# Patient Record
Sex: Female | Born: 1960 | Race: White | Hispanic: No | Marital: Single | Smoking: Never smoker
Health system: Southern US, Community
[De-identification: ages and names within clinical notes are randomized; demographics above are authoritative.]

## PROBLEM LIST (undated history)

## (undated) DIAGNOSIS — K649 Unspecified hemorrhoids: Secondary | ICD-10-CM

## (undated) DIAGNOSIS — M8580 Other specified disorders of bone density and structure, unspecified site: Secondary | ICD-10-CM

## (undated) DIAGNOSIS — Z789 Other specified health status: Secondary | ICD-10-CM

## (undated) DIAGNOSIS — C801 Malignant (primary) neoplasm, unspecified: Secondary | ICD-10-CM

## (undated) DIAGNOSIS — K8050 Calculus of bile duct without cholangitis or cholecystitis without obstruction: Secondary | ICD-10-CM

## (undated) DIAGNOSIS — C50811 Malignant neoplasm of overlapping sites of right female breast: Secondary | ICD-10-CM

## (undated) HISTORY — DX: Calculus of bile duct without cholangitis or cholecystitis without obstruction: K80.50

## (undated) HISTORY — PX: MASTECTOMY: SHX3

## (undated) HISTORY — DX: Other specified disorders of bone density and structure, unspecified site: M85.80

## (undated) HISTORY — DX: Malignant neoplasm of overlapping sites of right female breast: C50.811

## (undated) HISTORY — DX: Unspecified hemorrhoids: K64.9

---

## 2008-07-22 HISTORY — PX: BREAST LUMPECTOMY: SHX2

## 2009-07-22 DIAGNOSIS — C801 Malignant (primary) neoplasm, unspecified: Secondary | ICD-10-CM

## 2009-07-22 HISTORY — DX: Malignant (primary) neoplasm, unspecified: C80.1

## 2014-12-22 DIAGNOSIS — C50811 Malignant neoplasm of overlapping sites of right female breast: Secondary | ICD-10-CM | POA: Insufficient documentation

## 2014-12-22 DIAGNOSIS — M8580 Other specified disorders of bone density and structure, unspecified site: Secondary | ICD-10-CM | POA: Insufficient documentation

## 2014-12-22 HISTORY — DX: Malignant neoplasm of overlapping sites of right female breast: C50.811

## 2016-03-20 ENCOUNTER — Other Ambulatory Visit: Payer: Self-pay | Admitting: Family Medicine

## 2016-03-20 DIAGNOSIS — R591 Generalized enlarged lymph nodes: Secondary | ICD-10-CM

## 2016-03-26 ENCOUNTER — Ambulatory Visit
Admission: RE | Admit: 2016-03-26 | Discharge: 2016-03-26 | Disposition: A | Discharge status: Home / Self Care | Payer: BLUE CROSS/BLUE SHIELD | Source: Ambulatory Visit | Attending: Family Medicine | Admitting: Family Medicine

## 2016-03-26 DIAGNOSIS — R591 Generalized enlarged lymph nodes: Secondary | ICD-10-CM | POA: Insufficient documentation

## 2016-10-30 ENCOUNTER — Encounter: Payer: Self-pay | Admitting: Emergency Medicine

## 2016-10-30 ENCOUNTER — Emergency Department: Payer: BLUE CROSS/BLUE SHIELD

## 2016-10-30 ENCOUNTER — Emergency Department
Admission: EM | Admit: 2016-10-30 | Discharge: 2016-10-30 | Disposition: A | Discharge status: Home / Self Care | Payer: BLUE CROSS/BLUE SHIELD | Attending: Surgery | Admitting: Surgery

## 2016-10-30 DIAGNOSIS — Z853 Personal history of malignant neoplasm of breast: Secondary | ICD-10-CM | POA: Insufficient documentation

## 2016-10-30 DIAGNOSIS — K8070 Calculus of gallbladder and bile duct without cholecystitis without obstruction: Secondary | ICD-10-CM | POA: Diagnosis not present

## 2016-10-30 DIAGNOSIS — R1011 Right upper quadrant pain: Secondary | ICD-10-CM | POA: Diagnosis present

## 2016-10-30 DIAGNOSIS — K8050 Calculus of bile duct without cholangitis or cholecystitis without obstruction: Secondary | ICD-10-CM | POA: Diagnosis not present

## 2016-10-30 DIAGNOSIS — K8020 Calculus of gallbladder without cholecystitis without obstruction: Secondary | ICD-10-CM

## 2016-10-30 HISTORY — DX: Malignant (primary) neoplasm, unspecified: C80.1

## 2016-10-30 LAB — COMPREHENSIVE METABOLIC PANEL
ALBUMIN: 4.8 g/dL (ref 3.5–5.0)
ALK PHOS: 62 U/L (ref 38–126)
ALT: 20 U/L (ref 14–54)
AST: 31 U/L (ref 15–41)
Anion gap: 11 (ref 5–15)
BUN: 15 mg/dL (ref 6–20)
CALCIUM: 10 mg/dL (ref 8.9–10.3)
CHLORIDE: 101 mmol/L (ref 101–111)
CO2: 26 mmol/L (ref 22–32)
CREATININE: 0.8 mg/dL (ref 0.44–1.00)
GFR calc non Af Amer: >60 mL/min (ref >60)
GLUCOSE: 130 mg/dL — AB (ref 65–99)
Potassium: 4.4 mmol/L (ref 3.5–5.1)
SODIUM: 138 mmol/L (ref 135–145)
Total Bilirubin: 0.8 mg/dL (ref 0.3–1.2)
Total Protein: 8.4 g/dL — ABNORMAL HIGH (ref 6.5–8.1)

## 2016-10-30 LAB — LIPASE, BLOOD: LIPASE: 17 U/L (ref 11–51)

## 2016-10-30 LAB — URINALYSIS, COMPLETE (UACMP) WITH MICROSCOPIC
Bacteria, UA: NONE SEEN
Bilirubin Urine: NEGATIVE
Glucose, UA: NEGATIVE mg/dL
KETONES UR: NEGATIVE mg/dL
Nitrite: NEGATIVE
PH: 6 (ref 5.0–8.0)
PROTEIN: 30 mg/dL — AB
Specific Gravity, Urine: 1.025 (ref 1.005–1.030)

## 2016-10-30 LAB — CBC
HCT: 41.9 % (ref 35.0–47.0)
Hemoglobin: 13.9 g/dL (ref 12.0–16.0)
MCH: 27.4 pg (ref 26.0–34.0)
MCHC: 33.2 g/dL (ref 32.0–36.0)
MCV: 82.6 fL (ref 80.0–100.0)
PLATELETS: 268 10*3/uL (ref 150–440)
RBC: 5.07 MIL/uL (ref 3.80–5.20)
RDW: 13.6 % (ref 11.5–14.5)
WBC: 16.2 10*3/uL — ABNORMAL HIGH (ref 3.6–11.0)

## 2016-10-30 LAB — TROPONIN I: Troponin I: <0.03 ng/mL (ref <0.03)

## 2016-10-30 MED ORDER — OXYCODONE-ACETAMINOPHEN 5-325 MG PO TABS: 1.0000 | ORAL_TABLET | Freq: Four times a day (QID) | ORAL | 0 refills | Status: DC | PRN

## 2016-10-30 MED ORDER — FAMOTIDINE 20 MG PO TABS
40.0000 mg | ORAL_TABLET | Freq: Once | ORAL | Status: AC
  Administered 2016-10-30: 40 mg via ORAL
  Filled 2016-10-30: qty 2

## 2016-10-30 MED ORDER — NAPROXEN 500 MG PO TABS: 500.0000 mg | ORAL_TABLET | Freq: Two times a day (BID) | ORAL | 0 refills | Status: DC

## 2016-10-30 MED ORDER — GI COCKTAIL ~~LOC~~
30.0000 mL | ORAL | Status: AC
  Administered 2016-10-30: 30 mL via ORAL
  Filled 2016-10-30: qty 30

## 2016-10-30 MED ORDER — ONDANSETRON 4 MG PO TBDP: 4.0000 mg | ORAL_TABLET | Freq: Three times a day (TID) | ORAL | 0 refills | Status: DC | PRN

## 2016-10-30 MED ORDER — KETOROLAC TROMETHAMINE 60 MG/2ML IM SOLN
15.0000 mg | Freq: Once | INTRAMUSCULAR | Status: AC
  Administered 2016-10-30: 15 mg via INTRAMUSCULAR
  Filled 2016-10-30: qty 2

## 2016-10-30 MED ORDER — ONDANSETRON 4 MG PO TBDP
8.0000 mg | ORAL_TABLET | Freq: Once | ORAL | Status: AC
  Administered 2016-10-30: 8 mg via ORAL
  Filled 2016-10-30: qty 2

## 2016-10-30 NOTE — Discharge Instructions (Signed)
Take pain medicine as needed for your symptoms. If you develop unremitting vomiting, severe pain, or fever, return to the nearest hospital for evaluation.

## 2016-10-30 NOTE — ED Provider Notes (Signed)
[facility] Emergency Department Provider Note  ____________________________________________  Time seen: Approximately 4:25 PM  I have reviewed the triage vital signs and the nursing notes.   HISTORY  Chief Complaint Abdominal Pain    HPI Julie Logan is a 56 y.o. female who complains of upper abdominal pain since last night. Mostly constant, waxing and waning. Nausea and vomiting. No diarrhea or constipation. No fever or chills. Never had anything like this before. Rates it as severe. Worse with movement and change in position. Not exertional, not pleuritic. It does not seem to be affected by eating, but she has not eaten today.     Past Medical History:  Diagnosis Date  . Cancer [facility])    breast cancer  Internal hemorrhoids   Patient Active Problem List   Diagnosis Date Noted  . Biliary colic      History reviewed. No pertinent surgical history. Mastectomy Sigmoidoscopy 10 years ago  Prior to Admission medications   Medication Sig Start Date End Date Taking? Authorizing Provider  naproxen (NAPROSYN) 500 MG tablet Take 1 tablet (500 mg total) by mouth 2 (two) times daily with a meal. 10/30/16   Carrie Mew, MD  ondansetron (ZOFRAN ODT) 4 MG disintegrating tablet Take 1 tablet (4 mg total) by mouth every 8 (eight) hours as needed for nausea or vomiting. 10/30/16   Carrie Mew, MD  oxyCODONE-acetaminophen (ROXICET) 5-325 MG tablet Take 1 tablet by mouth every 6 (six) hours as needed for severe pain. 10/30/16   Carrie Mew, MD  None   Allergies Patient has no known allergies.   History reviewed. No pertinent family history.  Social History Social History  Substance Use Topics  . Smoking status: Never Smoker  . Smokeless tobacco: Never Used  . Alcohol use No    Review of Systems  Constitutional:   No fever or chills.  ENT:   No sore throat. No rhinorrhea. Cardiovascular:   No chest pain. Respiratory:   No dyspnea or  cough. Gastrointestinal:   Positive as above for abdominal pain with vomiting. No diarrhea..  Genitourinary:   Negative for dysuria or difficulty urinating. Musculoskeletal:   Negative for focal pain or swelling Neurological:   Negative for headaches 10-point ROS otherwise negative.  ____________________________________________   PHYSICAL EXAM:  VITAL SIGNS: ED Triage Vitals  Enc Vitals Group     BP 10/30/16 1409 126/75     Pulse Rate 10/30/16 1409 76     Resp --      Temp 10/30/16 1409 99.6 F (37.6 C)     Temp Source 10/30/16 1409 Oral     SpO2 10/30/16 1409 97 %     Weight 10/30/16 1409 170 lb (77.1 kg)     Height 10/30/16 1409 5\' 3"  (1.6 m)     Head Circumference --      Peak Flow --      Pain Score 10/30/16 1408 7     Pain Loc --      Pain Edu? --      Excl. in [place]? --     Vital signs reviewed, nursing assessments reviewed.   Constitutional:   Alert and oriented. Well appearing and in no distress. Eyes:   No scleral icterus. No conjunctival pallor. PERRL. EOMI.  No nystagmus. ENT   Head:   Normocephalic and atraumatic.   Nose:   No congestion/rhinnorhea. No septal hematoma   Mouth/Throat:   MMM, no pharyngeal erythema. No peritonsillar mass.    Neck:  No stridor. No SubQ emphysema. No meningismus. Hematological/Lymphatic/Immunilogical:   No cervical lymphadenopathy. Cardiovascular:   RRR. Symmetric bilateral radial and DP pulses.  No murmurs.  Respiratory:   Normal respiratory effort without tachypnea nor retractions. Breath sounds are clear and equal bilaterally. No wheezes/rales/rhonchi. Gastrointestinal:   Soft with significant right upper quadrant tenderness and otherwise mild generalized tenderness.. Non distended. There is no CVA tenderness.  No rebound, rigidity, or guarding. Genitourinary:   deferred Musculoskeletal:   Normal range of motion in all extremities. No joint effusions.  No lower extremity tenderness.  No edema. Neurologic:   Normal  speech and language.  CN 2-10 normal. Motor grossly intact. No gross focal neurologic deficits are appreciated.  Skin:    Skin is warm, dry and intact. No rash noted.  No petechiae, purpura, or bullae.  ____________________________________________    LABS (pertinent positives/negatives) (all labs ordered are listed, but only abnormal results are displayed) Labs Reviewed  COMPREHENSIVE METABOLIC PANEL - Abnormal; Notable for the following:       Result Value   Glucose, Bld 130 (*)    Total Protein 8.4 (*)    All other components within normal limits  CBC - Abnormal; Notable for the following:    WBC 16.2 (*)    All other components within normal limits  URINALYSIS, COMPLETE (UACMP) WITH MICROSCOPIC - Abnormal; Notable for the following:    Color, Urine YELLOW (*)    APPearance CLEAR (*)    Hgb urine dipstick SMALL (*)    Protein, ur 30 (*)    Leukocytes, UA TRACE (*)    Squamous Epithelial / LPF 0-5 (*)    All other components within normal limits  URINE CULTURE  LIPASE, BLOOD  TROPONIN I   ____________________________________________   EKG  Interpreted by me Normal sinus rhythm rate of 80, left axis, normal intervals. Poor R-wave progression in anterior precordial leads. Voltage criteria for LVH in the high lateral leads. No acute ischemic changes.  ____________________________________________    RADIOLOGY  US Abdomen Limited Ruq  Result Date: 10/30/2016 CLINICAL DATA:  Right upper quadrant abdominal pain.  Leukocytosis. EXAM: US ABDOMEN LIMITED - RIGHT UPPER QUADRANT COMPARISON:  None. FINDINGS: Gallbladder: The gallbladder wall is thickened measuring up to 7 mm. Multiple shadowing calculi measure up to 7 mm. There is no sonographic Murphy's sign. Common bile duct: Diameter: 3.1 mm, within normal limits. Liver: The liver is mildly echogenic. No discrete lesions are present. There is some loss of internal architecture. IMPRESSION: 1. Multiple mobile shadowing stones and  gallbladder wall thickening suggesting acute cholecystitis. 2. Increased echogenicity of the liver suggesting hepatic steatosis. Electronically Signed   By: San Morelle M.D.   On: 10/30/2016 16:52    ____________________________________________   PROCEDURES Procedures  ____________________________________________   INITIAL IMPRESSION / ASSESSMENT AND PLAN / ED COURSE  Pertinent labs & imaging results that were available during my care of the patient were reviewed by me and considered in my medical decision making (see chart for details).  Patient is well-appearing no acute distress, nonseptic and nontoxic. She presents with abdominal pain which is somewhat nonfocal and nonspecific, possibly gastroenteritis versus GERD versus [place] disease. She does have a slightly elevated temp at 99.6 and a leukocytosis of 16,000. Considering all this, we'll get an ultrasound to evaluate for cholecystitis. If negative and think the patient is suitable for outpatient follow-up and I would treat her with antacids for likely GERD and gastritis.     ----------------------------------------- 6:29 PM on  10/30/2016 -----------------------------------------  Ultrasound positive for cholelithiasis and some gallbladder wall thickening. This was discussed with surgery, in addition to her slightly elevated temperature and her leukocytosis. I discussed the patient's care with Dr. Burt Knack after he evaluated the patient in the ED. She reports that her pain is much better and Dr. Burt Knack reports on his exam that her tenderness is mild. In light of this, he doubts that it is cholecystitis and most likely a more simple biliary colic. The patient does not want to be admitted and does not want to have surgery at this time because she is going [place] vacation to the beach for the next [place]. She agrees to follow-up with Dr. Burt Knack as soon as she gets back. She has been counseled that she needs to go to the nearest emergency  room if she develops fever, intractable pain or vomiting, or other worsening of her condition. I'll give her Percocet and Zofran for symptom relief in the meantime.      ____________________________________________   FINAL CLINICAL IMPRESSION(S) / ED DIAGNOSES  Final diagnoses:  Calculus of gallbladder without cholecystitis without obstruction  Biliary colic      New Prescriptions   NAPROXEN (NAPROSYN) 500 MG TABLET    Take 1 tablet (500 mg total) by mouth 2 (two) times daily with a meal.   ONDANSETRON (ZOFRAN ODT) 4 MG DISINTEGRATING TABLET    Take 1 tablet (4 mg total) by mouth every 8 (eight) hours as needed for nausea or vomiting.   OXYCODONE-ACETAMINOPHEN (ROXICET) 5-325 MG TABLET    Take 1 tablet by mouth every 6 (six) hours as needed for severe pain.     Portions of this note were generated with dragon dictation software. Dictation errors may occur despite best attempts at proofreading.    Carrie Mew, MD 10/30/16 [phone]

## 2016-10-30 NOTE — ED Triage Notes (Signed)
Pt presents to ED c/o epigastric pain radiates lateral, constant, squeezing pain since 1 am this morning. Admits to vomit

## 2016-10-30 NOTE — Consult Note (Signed)
Surgical Consultation  10/30/2016  Julie Logan is an 56 y.o. female.   CC: Right upper quadrant pain  HPI: This a patient with several hours of right upper quadrant pain it happened early in the morning. She's never had an episode like this before she's had some nausea but no emesis denies fevers or chills and in fact states that her pain is much better now and she's had no back pain. A workup in the emergency room suggested acute cholecystitis but her pain is almost gone.  She has a history of breast cancer with partial mastectomy and radiation therapy with chemotherapy. She had a port placed as well.  Family history of gallbladder disease in her father.  She has had no abdominal surgery.  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 Cancer [facility])    breast cancer    History reviewed. No pertinent surgical history.  History reviewed. No pertinent family history.  Social History:  reports that she has never smoked. She has never used smokeless tobacco. She reports that she does not drink alcohol. Her drug history is not on file.  Allergies: Allergies no known allergies  Medications reviewed.   Review of Systems:   Review of Systems  Constitutional: Negative.   HENT: Negative.   Eyes: Negative.   Respiratory: Negative.   Cardiovascular: Negative.   Gastrointestinal: Positive for abdominal pain. Negative for constipation, diarrhea, heartburn, nausea and vomiting.  Genitourinary: Negative.   Musculoskeletal: Negative.   Skin: Negative.   Neurological: Negative.   Endo/Heme/Allergies: Negative.   Psychiatric/Behavioral: Negative.      Physical Exam:  BP 126/75   Pulse 76   Temp 99.6 F (37.6 C) (Oral)   Ht 5' 3"  (1.6 m)   Wt 170 lb (77.1 kg)   SpO2 97%   BMI 30.11 kg/m   Physical Exam  Constitutional: She is oriented to person, place, and time and well-developed, well-nourished, and  in no distress. No distress.  HENT:  Head: Normocephalic and atraumatic.  Neck: Normal range of motion.  Pulmonary/Chest: Effort normal and breath sounds normal. No respiratory distress. She has no wheezes. She has no rales.  Abdominal: Soft. She exhibits no distension. There is tenderness. There is no rebound and no guarding.  Minimal tenderness with a negative Murphy sign  Lymphadenopathy:    She has no cervical adenopathy.  Neurological: She is alert and oriented to person, place, and time.  Skin: Skin is warm and dry. She is not diaphoretic. No erythema.  Psychiatric: Mood and affect normal.  Vitals reviewed.     Results for orders placed or performed during the hospital encounter of 10/30/16 (from the past 48 hour(s))  Lipase, blood     Status: None   Collection Time: 10/30/16  2:08 PM  Result Value Ref Range   Lipase 17 11 - 51 U/L  Comprehensive metabolic panel     Status: Abnormal   Collection Time: 10/30/16  2:08 PM  Result Value Ref Range   Sodium 138 135 - 145 mmol/L   Potassium 4.4 3.5 - 5.1 mmol/L   Chloride 101 101 - 111 mmol/L   CO2 26 22 - 32 mmol/L   Glucose, Bld 130 (H) 65 - 99 mg/dL   BUN 15 6 - 20 mg/dL   Creatinine, Ser 0.80 0.44 - 1.00 mg/dL   Calcium 10.0 8.9 - 10.3 mg/dL   Total Protein 8.4 (H) 6.5 -  8.1 g/dL   Albumin 4.8 3.5 - 5.0 g/dL   AST 31 15 - 41 U/L   ALT 20 14 - 54 U/L   Alkaline Phosphatase 62 38 - 126 U/L   Total Bilirubin 0.8 0.3 - 1.2 mg/dL   GFR calc non Af Amer >60 >60 mL/min   GFR calc Af Amer >60 >60 mL/min    Comment: (NOTE) The eGFR has been calculated using the CKD EPI equation. This calculation has not been validated in all clinical situations. eGFR's persistently <60 mL/min signify possible Chronic Kidney Disease.    Anion gap 11 5 - 15  CBC     Status: Abnormal   Collection Time: 10/30/16  2:08 PM  Result Value Ref Range   WBC 16.2 (H) 3.6 - 11.0 K/uL   RBC 5.07 3.80 - 5.20 MIL/uL   Hemoglobin 13.9 12.0 - 16.0 g/dL    HCT 41.9 35.0 - 47.0 %   MCV 82.6 80.0 - 100.0 fL   MCH 27.4 26.0 - 34.0 pg   MCHC 33.2 32.0 - 36.0 g/dL   RDW 13.6 11.5 - 14.5 %   Platelets 268 150 - 440 K/uL  Troponin I     Status: None   Collection Time: 10/30/16  2:08 PM  Result Value Ref Range   Troponin I <0.03 <0.03 ng/mL  Urinalysis, Complete w Microscopic     Status: Abnormal   Collection Time: 10/30/16  3:13 PM  Result Value Ref Range   Color, Urine YELLOW (A) YELLOW   APPearance CLEAR (A) CLEAR   Specific Gravity, Urine 1.025 1.005 - 1.030   pH 6.0 5.0 - 8.0   Glucose, UA NEGATIVE NEGATIVE mg/dL   Hgb urine dipstick SMALL (A) NEGATIVE   Bilirubin Urine NEGATIVE NEGATIVE   Ketones, ur NEGATIVE NEGATIVE mg/dL   Protein, ur 30 (A) NEGATIVE mg/dL   Nitrite NEGATIVE NEGATIVE   Leukocytes, UA TRACE (A) NEGATIVE   RBC / HPF 0-5 0 - 5 RBC/hpf   WBC, UA 6-30 0 - 5 WBC/hpf   Bacteria, UA NONE SEEN NONE SEEN   Squamous Epithelial / LPF 0-5 (A) NONE SEEN   Mucous PRESENT    US Abdomen Limited Ruq  Result Date: 10/30/2016 CLINICAL DATA:  Right upper quadrant abdominal pain.  Leukocytosis. EXAM: US ABDOMEN LIMITED - RIGHT UPPER QUADRANT COMPARISON:  None. FINDINGS: Gallbladder: The gallbladder wall is thickened measuring up to 7 mm. Multiple shadowing calculi measure up to 7 mm. There is no sonographic Murphy's sign. Common bile duct: Diameter: 3.1 mm, within normal limits. Liver: The liver is mildly echogenic. No discrete lesions are present. There is some loss of internal architecture. IMPRESSION: 1. Multiple mobile shadowing stones and gallbladder wall thickening suggesting acute cholecystitis. 2. Increased echogenicity of the liver suggesting hepatic steatosis. Electronically Signed   By: San Morelle M.D.   On: 10/30/2016 16:52    Assessment/Plan:  This patient will likely be a biliary colic or studies been personally reviewed and clinically she does not have signs of acute cholecystitis. Her pain is almost gone.  With this being a first episode a discussed with her the rationale for offering surgery but she has a trip planned a [place] leaving tomorrow and returning on the weekend. She wishes to forego surgery if possible. As her pain is improved and almost gone and the likelihood of this coming back at some point is unpredictable and when then I suggested giving her pain medication for her trip and see her  in the office next week to remove his reminded to return to the emergency room if her pain comes back and is unrelenting and that cholecystectomy would be indicated if that were the case. She understood and agreed to proceed  Florene Glen, MD, FACS

## 2016-11-01 LAB — URINE CULTURE

## 2016-11-05 ENCOUNTER — Other Ambulatory Visit: Payer: Self-pay

## 2016-11-06 ENCOUNTER — Ambulatory Visit (INDEPENDENT_AMBULATORY_CARE_PROVIDER_SITE_OTHER): Payer: BLUE CROSS/BLUE SHIELD | Admitting: Surgery

## 2016-11-06 ENCOUNTER — Encounter: Payer: Self-pay | Admitting: Surgery

## 2016-11-06 VITALS — BP 119/79 | HR 87 | Temp 98.2°F | Ht 63.0 in | Wt 171.0 lb

## 2016-11-06 DIAGNOSIS — K8050 Calculus of bile duct without cholangitis or cholecystitis without obstruction: Secondary | ICD-10-CM

## 2016-11-06 NOTE — Patient Instructions (Signed)
Please look at your blue sheet in case you have any questions or concerns.

## 2016-11-06 NOTE — Progress Notes (Signed)
Outpatient Surgical Follow Up  11/06/2016  Julie Logan is an 57 y.o. female.   CC: Gallstones  HPI: This patient I met in the emergency room last week who had signs of biliary colic versus early acute cholecystitis. At that time surgical intervention or admission to the hospital was offered but the patient was on her way to a trip to [facility] head and wished to delay. Patient was able to travel to [facility] head on vacation and did well but now has what she describes as just a low-grade malaise area did his no pain some nausea she calls it "sore" and points to the right upper quadrant.  Past Medical History:  Diagnosis Date  . Cancer [facility])    breast cancer  . Osteopenia     Past Surgical History:  Procedure Laterality Date  . BREAST LUMPECTOMY Right 2010   Done at [facility] History  Problem Relation Age of Onset  . Breast cancer Mother 2  . Breast cancer Cousin 13  . Breast cancer Maternal Aunt 50  . Lung cancer Paternal Aunt   . Brain cancer Other 60  . Stomach cancer Other 44    Social History:  reports that she has never smoked. She has never used smokeless tobacco. She reports that she does not drink alcohol or use drugs.  Allergies: Allergies no known allergies  Medications reviewed.   Review of Systems:   Review of Systems  Constitutional: Negative.   HENT: Negative.   Eyes: Negative.   Respiratory: Negative.   Cardiovascular: Negative.   Gastrointestinal: Positive for abdominal pain and nausea. Negative for blood in stool, constipation, diarrhea, heartburn and vomiting.  Genitourinary: Negative.   Musculoskeletal: Negative.   Skin: Negative.   Neurological: Negative.   Endo/Heme/Allergies: Negative.   Psychiatric/Behavioral: Negative.      Physical Exam:  There were no vitals taken for this visit.  Physical Exam  Constitutional: She is oriented to person, place, and time and well-developed, well-nourished, and in no distress. No  distress.  HENT:  Head: Normocephalic and atraumatic.  Eyes: Pupils are equal, round, and reactive to light. Right eye exhibits no discharge. Left eye exhibits no discharge. No scleral icterus.  Neck: Normal range of motion.  Cardiovascular: Normal rate.   Pulmonary/Chest: Effort normal. No respiratory distress.  Abdominal: Soft. She exhibits no distension. There is tenderness. There is no rebound and no guarding.  Very minimal right upper quadrant tenderness with a negative Murphy sign  Musculoskeletal: Normal range of motion. She exhibits no edema.  Lymphadenopathy:    She has no cervical adenopathy.  Neurological: She is alert and oriented to person, place, and time.  Skin: Skin is warm and dry. She is not diaphoretic.  Psychiatric: Mood and affect normal.  Vitals reviewed.     No results found for this or any previous visit (from the past 48 hour(s)). No results found.  Assessment/Plan:  This patient with gallstones and a history of biliary colic possible early acute cholecystitis. She was able to go on vacation and now is here to discuss surgical options. I had met her in the emergency room originally. Her symptoms are minimal but consistent with biliary colic. Patient wants to schedule at her convenience in conjunction with her daughter's work schedule. We will plan laparoscopic cholecystectomy. The rationale for this been discussed the options of observation reviewed the risk bleeding infection recurrence of symptoms failure to resolve her symptoms conversion to an open procedure bile duct damage or  bowel injury any of which could require further surgery were all discussed with her multiple questions were answered she understood and agreed to proceed. She has narcotics that were given in the emergency room in case she had another attack which she can use postoperatively.  Julie Glen, MD, FACS

## 2016-11-08 ENCOUNTER — Telehealth: Payer: Self-pay

## 2016-11-08 NOTE — Telephone Encounter (Signed)
No authorization required for CPT [phone] and ICD 10-K80.50 per Stephanie L. @ [place].

## 2016-11-08 NOTE — Telephone Encounter (Signed)
Patient has been advised of Surgery Date as well as Pre-Admission appointment date, time, and location.  Surgery Date: 12/06/16  Pre-admit Appointment: 11/29/16 from 9a-1p  Patient has been advised to call [phone] the day before surgery between 1-3pm to obtain arrival time.

## 2016-11-29 ENCOUNTER — Encounter
Admission: RE | Admit: 2016-11-29 | Discharge: 2016-11-29 | Disposition: A | Discharge status: Home / Self Care | Payer: BLUE CROSS/BLUE SHIELD | Source: Ambulatory Visit | Attending: Surgery | Admitting: Surgery

## 2016-11-29 HISTORY — DX: Other specified health status: Z78.9

## 2016-11-29 NOTE — Patient Instructions (Signed)
  Your procedure is scheduled on: 12/06/16 Report to Day Surgery. To find out your arrival time please call [phone] between 1PM - 3PM on  12/05/16.  Remember: Instructions that are not followed completely may result in serious medical risk, up to and including death, or upon the discretion of your surgeon and anesthesiologist your surgery may need to be rescheduled.    __X__ 1. Do not eat food or drink liquids after midnight. No gum chewing or hard candies.     ____ 2. No Alcohol for 24 hours before or after surgery.   ____ 3. Do Not Smoke For 24 Hours Prior to Your Surgery.   ____ 4. Bring all medications with you on the day of surgery if instructed.    _X___ 5. Notify your doctor if there is any change in your medical condition     (cold, fever, infections).       Do not wear jewelry, make-up, hairpins, clips or nail polish.  Do not wear lotions, powders, or perfumes. You may wear deodorant.  Do not shave 48 hours prior to surgery. Men may shave face and neck.  Do not bring valuables to the hospital.    [facility] is not responsible for any belongings or valuables.               Contacts, dentures or bridgework may not be worn into surgery.  Leave your suitcase in the car. After surgery it may be brought to your room.  For patients admitted to the hospital, discharge time is determined by your                treatment team.   Patients discharged the day of surgery will not be allowed to drive home.   Please read over the following fact sheets that you were given:   Surgical Site Infection Prevention   ____ Take these medicines the morning of surgery with A SIP OF WATER:    1. NONE  2.   3.   4.  5.  6.  ____ Fleet Enema (as directed)   ____ Use CHG Soap as directed  ____ Use inhalers on the day of surgery  ____ Stop metformin 2 days prior to surgery    ____ Take 1/2 of usual insulin dose the night before surgery and none on the morning of surgery.   ____  Stop Coumadin/Plavix/aspirin on   ____ Stop Anti-inflammatories on    ____ Stop supplements until after surgery.    ____ Bring C-Pap to the hospital.

## 2016-12-03 ENCOUNTER — Encounter
Admission: RE | Admit: 2016-12-03 | Discharge: 2016-12-03 | Disposition: A | Discharge status: Home / Self Care | Payer: BLUE CROSS/BLUE SHIELD | Source: Ambulatory Visit | Attending: Surgery | Admitting: Surgery

## 2016-12-03 DIAGNOSIS — K8010 Calculus of gallbladder with chronic cholecystitis without obstruction: Secondary | ICD-10-CM | POA: Diagnosis present

## 2016-12-03 LAB — COMPREHENSIVE METABOLIC PANEL
ALK PHOS: 100 U/L (ref 38–126)
ALT: 81 U/L — ABNORMAL HIGH (ref 14–54)
ANION GAP: 8 (ref 5–15)
AST: 21 U/L (ref 15–41)
Albumin: 4.6 g/dL (ref 3.5–5.0)
BILIRUBIN TOTAL: 0.7 mg/dL (ref 0.3–1.2)
BUN: 18 mg/dL (ref 6–20)
CALCIUM: 9.6 mg/dL (ref 8.9–10.3)
CO2: 25 mmol/L (ref 22–32)
CREATININE: 0.68 mg/dL (ref 0.44–1.00)
Chloride: 106 mmol/L (ref 101–111)
GFR calc non Af Amer: >60 mL/min (ref >60)
GLUCOSE: 93 mg/dL (ref 65–99)
Potassium: 3.8 mmol/L (ref 3.5–5.1)
Sodium: 139 mmol/L (ref 135–145)
TOTAL PROTEIN: 7.8 g/dL (ref 6.5–8.1)

## 2016-12-03 LAB — CBC WITH DIFFERENTIAL/PLATELET
Basophils Absolute: 0.1 10*3/uL (ref 0–0.1)
Basophils Relative: 1 %
Eosinophils Absolute: 0.2 10*3/uL (ref 0–0.7)
Eosinophils Relative: 3 %
HEMATOCRIT: 37.5 % (ref 35.0–47.0)
HEMOGLOBIN: 12.4 g/dL (ref 12.0–16.0)
LYMPHS ABS: 2.8 10*3/uL (ref 1.0–3.6)
LYMPHS PCT: 38 %
MCH: 26.9 pg (ref 26.0–34.0)
MCHC: 33 g/dL (ref 32.0–36.0)
MCV: 81.6 fL (ref 80.0–100.0)
MONOS PCT: 6 %
Monocytes Absolute: 0.4 10*3/uL (ref 0.2–0.9)
NEUTROS ABS: 4 10*3/uL (ref 1.4–6.5)
NEUTROS PCT: 52 %
Platelets: 251 10*3/uL (ref 150–440)
RBC: 4.6 MIL/uL (ref 3.80–5.20)
RDW: 13.7 % (ref 11.5–14.5)
WBC: 7.4 10*3/uL (ref 3.6–11.0)

## 2016-12-05 MED ORDER — CEFAZOLIN SODIUM-DEXTROSE 2-4 GM/100ML-% IV SOLN
2.0000 g | INTRAVENOUS | Status: AC
  Administered 2016-12-06: 2 g via INTRAVENOUS

## 2016-12-06 ENCOUNTER — Ambulatory Visit: Payer: BLUE CROSS/BLUE SHIELD | Admitting: Anesthesiology

## 2016-12-06 ENCOUNTER — Encounter: Payer: Self-pay | Admitting: *Deleted

## 2016-12-06 ENCOUNTER — Encounter
Admission: RE | Disposition: A | Discharge status: Home / Self Care | Payer: Self-pay | Source: Ambulatory Visit | Attending: Surgery

## 2016-12-06 ENCOUNTER — Ambulatory Visit
Admission: RE | Admit: 2016-12-06 | Discharge: 2016-12-06 | Disposition: A | Discharge status: Home / Self Care | Payer: BLUE CROSS/BLUE SHIELD | Source: Ambulatory Visit | Attending: Surgery | Admitting: Surgery

## 2016-12-06 DIAGNOSIS — K8010 Calculus of gallbladder with chronic cholecystitis without obstruction: Secondary | ICD-10-CM | POA: Diagnosis not present

## 2016-12-06 DIAGNOSIS — K8050 Calculus of bile duct without cholangitis or cholecystitis without obstruction: Secondary | ICD-10-CM

## 2016-12-06 HISTORY — PX: CHOLECYSTECTOMY: SHX55

## 2016-12-06 SURGERY — LAPAROSCOPIC CHOLECYSTECTOMY
Anesthesia: General

## 2016-12-06 MED ORDER — HEPARIN SODIUM (PORCINE) 5000 UNIT/ML IJ SOLN
5000.0000 [IU] | Freq: Once | INTRAMUSCULAR | Status: AC
  Administered 2016-12-06: 5000 [IU] via SUBCUTANEOUS

## 2016-12-06 MED ORDER — FAMOTIDINE 20 MG PO TABS
ORAL_TABLET | ORAL | Status: AC
  Administered 2016-12-06: 20 mg via ORAL
  Filled 2016-12-06: qty 1

## 2016-12-06 MED ORDER — FENTANYL CITRATE (PF) 100 MCG/2ML IJ SOLN
INTRAMUSCULAR | Status: DC | PRN
  Administered 2016-12-06 (×3): 50 ug via INTRAVENOUS

## 2016-12-06 MED ORDER — CHLORHEXIDINE GLUCONATE CLOTH 2 % EX PADS: 6.0000 | MEDICATED_PAD | Freq: Once | CUTANEOUS | Status: DC

## 2016-12-06 MED ORDER — ROCURONIUM BROMIDE 50 MG/5ML IV SOLN
INTRAVENOUS | Status: AC
  Filled 2016-12-06: qty 1

## 2016-12-06 MED ORDER — EPHEDRINE SULFATE 50 MG/ML IJ SOLN
INTRAMUSCULAR | Status: DC | PRN
  Administered 2016-12-06: 10 mg via INTRAVENOUS
  Administered 2016-12-06: 5 mg via INTRAVENOUS
  Administered 2016-12-06: 10 mg via INTRAVENOUS

## 2016-12-06 MED ORDER — BUPIVACAINE-EPINEPHRINE (PF) 0.25% -1:200000 IJ SOLN
INTRAMUSCULAR | Status: DC | PRN
  Administered 2016-12-06: 5 mL

## 2016-12-06 MED ORDER — MEPERIDINE HCL 50 MG/ML IJ SOLN: 6.2500 mg | INTRAMUSCULAR | Status: DC | PRN

## 2016-12-06 MED ORDER — OXYCODONE-ACETAMINOPHEN 5-325 MG PO TABS: 1.0000 | ORAL_TABLET | Freq: Four times a day (QID) | ORAL | 0 refills | Status: DC | PRN

## 2016-12-06 MED ORDER — DEXAMETHASONE SODIUM PHOSPHATE 10 MG/ML IJ SOLN
INTRAMUSCULAR | Status: DC | PRN
  Administered 2016-12-06: 10 mg via INTRAVENOUS

## 2016-12-06 MED ORDER — ROCURONIUM BROMIDE 100 MG/10ML IV SOLN
INTRAVENOUS | Status: DC | PRN
  Administered 2016-12-06: 30 mg via INTRAVENOUS
  Administered 2016-12-06 (×2): 10 mg via INTRAVENOUS

## 2016-12-06 MED ORDER — PROPOFOL 10 MG/ML IV BOLUS
INTRAVENOUS | Status: DC | PRN
  Administered 2016-12-06: 150 mg via INTRAVENOUS

## 2016-12-06 MED ORDER — MIDAZOLAM HCL 2 MG/2ML IJ SOLN
INTRAMUSCULAR | Status: AC
  Filled 2016-12-06: qty 2

## 2016-12-06 MED ORDER — BUPIVACAINE-EPINEPHRINE (PF) 0.25% -1:200000 IJ SOLN
INTRAMUSCULAR | Status: AC
  Filled 2016-12-06: qty 30

## 2016-12-06 MED ORDER — GLYCOPYRROLATE 0.2 MG/ML IJ SOLN
INTRAMUSCULAR | Status: DC | PRN
  Administered 2016-12-06: 0.2 mg via INTRAVENOUS

## 2016-12-06 MED ORDER — FENTANYL CITRATE (PF) 100 MCG/2ML IJ SOLN: 25.0000 ug | INTRAMUSCULAR | Status: DC | PRN

## 2016-12-06 MED ORDER — DEXAMETHASONE SODIUM PHOSPHATE 10 MG/ML IJ SOLN
INTRAMUSCULAR | Status: AC
  Filled 2016-12-06: qty 1

## 2016-12-06 MED ORDER — HEPARIN SODIUM (PORCINE) 5000 UNIT/ML IJ SOLN
INTRAMUSCULAR | Status: AC
  Administered 2016-12-06: 5000 [IU] via SUBCUTANEOUS
  Filled 2016-12-06: qty 1

## 2016-12-06 MED ORDER — FAMOTIDINE 20 MG PO TABS
20.0000 mg | ORAL_TABLET | Freq: Once | ORAL | Status: AC
  Administered 2016-12-06: 20 mg via ORAL

## 2016-12-06 MED ORDER — KETOROLAC TROMETHAMINE 30 MG/ML IJ SOLN
INTRAMUSCULAR | Status: AC
  Filled 2016-12-06: qty 1

## 2016-12-06 MED ORDER — SUGAMMADEX SODIUM 200 MG/2ML IV SOLN
INTRAVENOUS | Status: DC | PRN
  Administered 2016-12-06: 160 mg via INTRAVENOUS

## 2016-12-06 MED ORDER — MIDAZOLAM HCL 2 MG/2ML IJ SOLN
INTRAMUSCULAR | Status: DC | PRN
  Administered 2016-12-06: 2 mg via INTRAVENOUS

## 2016-12-06 MED ORDER — LACTATED RINGERS IV SOLN
INTRAVENOUS | Status: DC
  Administered 2016-12-06: 11:00:00 via INTRAVENOUS
  Administered 2016-12-06: 75 mL/h via INTRAVENOUS

## 2016-12-06 MED ORDER — OXYCODONE HCL 5 MG PO TABS: 5.0000 mg | ORAL_TABLET | Freq: Once | ORAL | Status: DC | PRN

## 2016-12-06 MED ORDER — FENTANYL CITRATE (PF) 100 MCG/2ML IJ SOLN
INTRAMUSCULAR | Status: AC
Start: 2016-12-06 — End: ?
  Filled 2016-12-06: qty 2

## 2016-12-06 MED ORDER — PROMETHAZINE HCL 25 MG/ML IJ SOLN: 6.2500 mg | INTRAMUSCULAR | Status: DC | PRN

## 2016-12-06 MED ORDER — OXYCODONE HCL 5 MG/5ML PO SOLN: 5.0000 mg | Freq: Once | ORAL | Status: DC | PRN

## 2016-12-06 MED ORDER — FENTANYL CITRATE (PF) 100 MCG/2ML IJ SOLN
INTRAMUSCULAR | Status: AC
  Filled 2016-12-06: qty 2

## 2016-12-06 MED ORDER — LIDOCAINE HCL (PF) 2 % IJ SOLN
INTRAMUSCULAR | Status: AC
  Filled 2016-12-06: qty 2

## 2016-12-06 MED ORDER — KETOROLAC TROMETHAMINE 30 MG/ML IJ SOLN
INTRAMUSCULAR | Status: DC | PRN
  Administered 2016-12-06: 30 mg via INTRAVENOUS

## 2016-12-06 MED ORDER — SUGAMMADEX SODIUM 200 MG/2ML IV SOLN
INTRAVENOUS | Status: AC
  Filled 2016-12-06: qty 2

## 2016-12-06 MED ORDER — PROPOFOL 10 MG/ML IV BOLUS
INTRAVENOUS | Status: AC
  Filled 2016-12-06: qty 20

## 2016-12-06 SURGICAL SUPPLY — 43 items
ADHESIVE MASTISOL STRL (MISCELLANEOUS) ×3 IMPLANT
APPLIER CLIP ROT 10 11.4 M/L (STAPLE) ×3
BLADE SURG SZ11 CARB STEEL (BLADE) ×3 IMPLANT
CANISTER SUCT 1200ML W/VALVE (MISCELLANEOUS) ×3 IMPLANT
CATH CHOLANGI 4FR 420404F (CATHETERS) IMPLANT
CHLORAPREP W/TINT 26ML (MISCELLANEOUS) ×3 IMPLANT
CLIP APPLIE ROT 10 11.4 M/L (STAPLE) ×1 IMPLANT
CLOSURE WOUND 1/2 X4 (GAUZE/BANDAGES/DRESSINGS) ×1
CONRAY 60ML FOR OR (MISCELLANEOUS) IMPLANT
DRAPE C-ARM XRAY 36X54 (DRAPES) IMPLANT
ELECT REM PT RETURN 9FT ADLT (ELECTROSURGICAL) ×3
ELECTRODE REM PT RTRN 9FT ADLT (ELECTROSURGICAL) ×1 IMPLANT
ENDOPOUCH RETRIEVER 10 (MISCELLANEOUS) ×3 IMPLANT
GAUZE SPONGE NON-WVN 2X2 STRL (MISCELLANEOUS) ×4 IMPLANT
GLOVE BIO SURGEON STRL SZ8 (GLOVE) ×9 IMPLANT
GOWN STRL REUS W/ TWL LRG LVL3 (GOWN DISPOSABLE) ×3 IMPLANT
GOWN STRL REUS W/TWL LRG LVL3 (GOWN DISPOSABLE) ×6
IRRIGATION STRYKERFLOW (MISCELLANEOUS) ×1 IMPLANT
IRRIGATOR STRYKERFLOW (MISCELLANEOUS) ×3
IV CATH ANGIO 12GX3 LT BLUE (NEEDLE) IMPLANT
IV NS 1000ML (IV SOLUTION) ×2
IV NS 1000ML BAXH (IV SOLUTION) ×1 IMPLANT
JACKSON PRATT 10 (INSTRUMENTS) IMPLANT
KIT RM TURNOVER STRD PROC AR (KITS) ×3 IMPLANT
LABEL OR SOLS (LABEL) ×3 IMPLANT
NDL SAFETY 22GX1.5 (NEEDLE) ×3 IMPLANT
NEEDLE VERESS 14GA 120MM (NEEDLE) ×3 IMPLANT
NS IRRIG 500ML POUR BTL (IV SOLUTION) ×3 IMPLANT
PACK LAP CHOLECYSTECTOMY (MISCELLANEOUS) ×3 IMPLANT
SCISSORS METZENBAUM CVD 33 (INSTRUMENTS) ×3 IMPLANT
SLEEVE ENDOPATH XCEL 5M (ENDOMECHANICALS) ×6 IMPLANT
SPONGE LAP 18X18 5 PK (GAUZE/BANDAGES/DRESSINGS) ×3 IMPLANT
SPONGE VERSALON 2X2 STRL (MISCELLANEOUS) ×8
SPONGE VERSALON 4X4 4PLY (MISCELLANEOUS) IMPLANT
STRIP CLOSURE SKIN 1/2X4 (GAUZE/BANDAGES/DRESSINGS) ×2 IMPLANT
SUT MNCRL 4-0 (SUTURE) ×2
SUT MNCRL 4-0 27XMFL (SUTURE) ×1
SUT VICRYL 0 AB UR-6 (SUTURE) ×3 IMPLANT
SUTURE MNCRL 4-0 27XMF (SUTURE) ×1 IMPLANT
SYR 20CC LL (SYRINGE) ×3 IMPLANT
TROCAR XCEL NON-BLD 11X100MML (ENDOMECHANICALS) ×3 IMPLANT
TROCAR XCEL NON-BLD 5MMX100MML (ENDOMECHANICALS) ×3 IMPLANT
TUBING INSUFFLATOR HI FLOW (MISCELLANEOUS) ×3 IMPLANT

## 2016-12-06 NOTE — Anesthesia Post-op Follow-up Note (Cosign Needed)
Anesthesia QCDR form completed.        

## 2016-12-06 NOTE — Discharge Instructions (Addendum)
Remove dressing in 24 hours. °May shower in 24 hours. °Leave paper strips in place. °Resume all home medications. °Follow-up with Dr. Cooper in 10 days. ° °AMBULATORY SURGERY  °DISCHARGE INSTRUCTIONS ° ° °1) The drugs that you were given will stay in your system until tomorrow so for the next 24 hours you should not: ° °A) Drive an automobile °B) Make any legal decisions °C) Drink any alcoholic beverage ° ° °2) You may resume regular meals tomorrow.  Today it is better to start with liquids and gradually work up to solid foods. ° °You may eat anything you prefer, but it is better to start with liquids, then soup and crackers, and gradually work up to solid foods. ° ° °3) Please notify your doctor immediately if you have any unusual bleeding, trouble breathing, redness and pain at the surgery site, drainage, fever, or pain not relieved by medication. ° ° ° °4) Additional Instructions: ° ° ° ° ° ° ° °Please contact your physician with any problems or Same Day Surgery at [phone], Monday through Friday 6 am to 4 pm, or [place] at [place] Main number at [phone]. °

## 2016-12-06 NOTE — Anesthesia Preprocedure Evaluation (Signed)
Anesthesia Evaluation  Patient identified by MRN, date of birth, ID band Patient awake    Reviewed: Allergy & Precautions, NPO status , Patient's Chart, lab work & pertinent test results  History of Anesthesia Complications Negative for: history of anesthetic complications  Airway Mallampati: I  TM Distance: >3 FB Neck ROM: Full    Dental no notable dental hx.    Pulmonary neg pulmonary ROS, neg sleep apnea, neg COPD,    breath sounds clear to auscultation- rhonchi (-) wheezing      Cardiovascular Exercise Tolerance: Good (-) hypertension(-) CAD and (-) Past MI  Rhythm:Regular Rate:Normal - Systolic murmurs and - Diastolic murmurs    Neuro/Psych negative neurological ROS  negative psych ROS   GI/Hepatic negative GI ROS, Neg liver ROS,   Endo/Other  negative endocrine ROSneg diabetes  Renal/GU negative Renal ROS     Musculoskeletal negative musculoskeletal ROS (+)   Abdominal (+) - obese,   Peds  Hematology negative hematology ROS (+)   Anesthesia Other Findings   Reproductive/Obstetrics                             Anesthesia Physical Anesthesia Plan  ASA: I  Anesthesia Plan: General   Post-op Pain Management:    Induction: Intravenous  Airway Management Planned: Oral ETT  Additional Equipment:   Intra-op Plan:   Post-operative Plan: Extubation in OR  Informed Consent: I have reviewed the patients History and Physical, chart, labs and discussed the procedure including the risks, benefits and alternatives for the proposed anesthesia with the patient or authorized representative who has indicated his/her understanding and acceptance.   Dental advisory given  Plan Discussed with: CRNA and Anesthesiologist  Anesthesia Plan Comments:         Anesthesia Quick Evaluation

## 2016-12-06 NOTE — Op Note (Signed)
Laparoscopic Cholecystectomy  Pre-operative Diagnosis: Biliary colic  Post-operative Diagnosis: Biliary colic and chronic cholecystitis  Procedure: Laparoscopic cholecystectomy  Surgeon: Jerrol Banana. Burt Knack, MD FACS  Anesthesia: Gen. with endotracheal tube  Assistant: Surgical tech  Procedure Details  The patient was seen again in the Holding Room. The benefits, complications, treatment options, and expected outcomes were discussed with the patient. The risks of bleeding, infection, recurrence of symptoms, failure to resolve symptoms, bile duct damage, bile duct leak, retained common bile duct stone, bowel injury, any of which could require further surgery and/or ERCP, stent, or papillotomy were reviewed with the patient. The likelihood of improving the patient's symptoms with return to their baseline status is good.  The patient and/or family concurred with the proposed plan, giving informed consent.  The patient was taken to Operating Room, identified as Julie Logan and the procedure verified as Laparoscopic Cholecystectomy.  A Time Out was held and the above information confirmed.  Prior to the induction of general anesthesia, antibiotic prophylaxis was administered. VTE prophylaxis was in place. General endotracheal anesthesia was then administered and tolerated well. After the induction, the abdomen was prepped with Chloraprep and draped in the sterile fashion. The patient was positioned in the supine position.  Local anesthetic  was injected into the skin near the umbilicus and an incision made. The Veress needle was placed. Pneumoperitoneum was then created with CO2 and tolerated well without any adverse changes in the patient's vital signs. A 63mm port was placed in the periumbilical position and the abdominal cavity was explored.  Two 5-mm ports were placed in the right upper quadrant and a 12 mm epigastric port was placed all under direct vision. All skin incisions  were infiltrated  with a local anesthetic agent before making the incision and placing the trocars.   The patient was positioned  in reverse Trendelenburg, tilted slightly to the patient's left.  The gallbladder was identified, the fundus grasped and retracted cephalad. Adhesions were lysed bluntly. The gallbladder was contracted and scarred verified and fibrosed. The infundibulum was grasped and retracted laterally, exposing the peritoneum overlying the triangle of Calot. This was then divided and exposed in a blunt fashion. A critical view of the cystic duct and cystic artery was obtained.  The cystic duct was clearly identified and bluntly dissected.   The cystic artery was doubly clipped and divided. This allowed for good visualization of the cystic duct as it entered the infundibulum of the gallbladder. Here it was doubly clipped and divided.  The gallbladder was taken from the gallbladder fossa in a retrograde fashion with the electrocautery. The gallbladder was removed and placed in an Endocatch bag. The liver bed was irrigated and inspected. Hemostasis was achieved with the electrocautery. Copious irrigation was utilized and was repeatedly aspirated until clear.  The gallbladder and Endocatch sac were then removed through the epigastric port site.   Inspection of the right upper quadrant was performed. No bleeding, bile duct injury or leak, or bowel injury was noted. Pneumoperitoneum was released.  The epigastric port site was closed with figure-of-eight 0 Vicryl sutures. 4-0 subcuticular Monocryl was used to close the skin. Steristrips and Mastisol and sterile dressings were  applied.  The patient was then extubated and brought to the recovery room in stable condition. Sponge, lap, and needle counts were correct at closure and at the conclusion of the case.   Findings: Chronic Cholecystitis   Estimated Blood Loss: Minimal         Drains: None  Specimens: Gallbladder           Complications: none                Julie Logan E. Burt Knack, MD, FACS

## 2016-12-06 NOTE — Anesthesia Postprocedure Evaluation (Signed)
Anesthesia Post Note  Patient: Julie Logan  Procedure(s) Performed: Procedure(s) (LRB): LAPAROSCOPIC CHOLECYSTECTOMY (N/A)  Patient location during evaluation: PACU Anesthesia Type: General Level of consciousness: awake and alert and oriented Pain management: pain level controlled Vital Signs Assessment: post-procedure vital signs reviewed and stable Respiratory status: spontaneous breathing, nonlabored ventilation and respiratory function stable Cardiovascular status: blood pressure returned to baseline and stable Postop Assessment: no signs of nausea or vomiting Anesthetic complications: no     Last Vitals:  Vitals:   12/06/16 1244 12/06/16 1259  BP: (!) 110/59 (!) 102/53  Pulse: 79 76  Resp: 19 11  Temp:  36.7 C    Last Pain:  Vitals:   12/06/16 1259  TempSrc:   PainSc: 0-No pain                 Carolee Channell

## 2016-12-06 NOTE — Anesthesia Procedure Notes (Signed)
Procedure Name: Intubation Date/Time: 12/06/2016 11:24 AM Performed by: Allean Found Pre-anesthesia Checklist: Patient identified, Emergency Drugs available, Suction available, Patient being monitored and Timeout performed Patient Re-evaluated:Patient Re-evaluated prior to inductionOxygen Delivery Method: Circle system utilized Preoxygenation: Pre-oxygenation with 100% oxygen Intubation Type: IV induction Ventilation: Mask ventilation without difficulty Laryngoscope Size: Mac and 3 Grade View: Grade I Tube type: Oral Tube size: 7.0 mm Number of attempts: 1 Airway Equipment and Method: Stylet Placement Confirmation: ETT inserted through vocal cords under direct vision,  positive ETCO2 and breath sounds checked- equal and bilateral Secured at: 21 cm Tube secured with: Tape Dental Injury: Teeth and Oropharynx as per pre-operative assessment

## 2016-12-06 NOTE — Progress Notes (Signed)
Preoperative Review   Patient is met in the preoperative holding area. The history is reviewed in the chart and with the patient. I personally reviewed the options and rationale as well as the risks of this procedure that have been previously discussed with the patient. All questions asked by the patient and/or family were answered to their satisfaction.  Patient agrees to proceed with this procedure at this time.  Jani Moronta E Andrea Colglazier M.D. FACS  

## 2016-12-06 NOTE — Transfer of Care (Signed)
Immediate Anesthesia Transfer of Care Note  Patient: Julie Logan  Procedure(s) Performed: Procedure(s): LAPAROSCOPIC CHOLECYSTECTOMY (N/A)  Patient Location: PACU  Anesthesia Type:General  Level of Consciousness: drowsy and responds to stimulation  Airway & Oxygen Therapy: Patient Spontanous Breathing and Patient connected to nasal cannula oxygen  Post-op Assessment: Report given to RN and Post -op Vital signs reviewed and stable  Post vital signs: Reviewed and stable  Last Vitals:  Vitals:   12/06/16 1030 12/06/16 1214  BP: 100/70 (!) 104/42  Pulse: 84 87  Resp: 15 14  Temp: 36.9 C 36.3 C    Last Pain:  Vitals:   12/06/16 1214  TempSrc: Temporal  PainSc: Asleep      Patients Stated Pain Goal: 0 (22/97/98 9211)  Complications: No apparent anesthesia complications

## 2016-12-09 ENCOUNTER — Encounter: Payer: Self-pay | Admitting: Surgery

## 2016-12-09 LAB — SURGICAL PATHOLOGY

## 2016-12-17 ENCOUNTER — Other Ambulatory Visit: Payer: Self-pay

## 2016-12-23 ENCOUNTER — Ambulatory Visit (INDEPENDENT_AMBULATORY_CARE_PROVIDER_SITE_OTHER): Payer: BLUE CROSS/BLUE SHIELD | Admitting: Surgery

## 2016-12-23 ENCOUNTER — Encounter: Payer: Self-pay | Admitting: Surgery

## 2016-12-23 VITALS — BP 113/70 | HR 88 | Temp 97.8°F | Ht 63.0 in | Wt 165.6 lb

## 2016-12-23 DIAGNOSIS — K8050 Calculus of bile duct without cholangitis or cholecystitis without obstruction: Secondary | ICD-10-CM

## 2016-12-23 NOTE — Patient Instructions (Signed)
Please call our office if you have questions or concerns.   

## 2016-12-23 NOTE — Progress Notes (Signed)
Outpatient postop visit  12/23/2016  Julie Logan is an 56 y.o. female.    Procedure: Laparoscopic cholecystectomy  CC:no pain  HPI: This patient underwent an elective laparoscopic cholecystectomy. Patient is feeling very well she took absolutely no pain pills after surgery. She took the week off of work but states she could've easily gone back to work. No fevers or chills no nausea or vomiting eating well.  Medications reviewed.    Physical Exam:  There were no vitals taken for this visit.    PE: No icterus no jaundice abdomen is soft and nontender wounds are clean no erythema.    Assessment/Plan:  Pathology is reviewed and no sign of malignancy. Chronic cholecystitis. Patient doing very well recommend follow up on an as-needed basis Florene Glen, MD, FACS

## 2017-12-10 ENCOUNTER — Ambulatory Visit: Payer: Self-pay | Admitting: Adult Health

## 2017-12-10 ENCOUNTER — Encounter: Payer: Self-pay | Admitting: Adult Health

## 2017-12-10 VITALS — BP 130/67 | HR 83 | Temp 98.1°F | Resp 16 | Ht 63.0 in | Wt 173.0 lb

## 2017-12-10 DIAGNOSIS — R0982 Postnasal drip: Secondary | ICD-10-CM

## 2017-12-10 DIAGNOSIS — J019 Acute sinusitis, unspecified: Secondary | ICD-10-CM

## 2017-12-10 DIAGNOSIS — J40 Bronchitis, not specified as acute or chronic: Secondary | ICD-10-CM

## 2017-12-10 MED ORDER — AMOXICILLIN-POT CLAVULANATE 875-125 MG PO TABS: 1.0000 | ORAL_TABLET | Freq: Two times a day (BID) | ORAL | 0 refills | Status: DC

## 2017-12-10 MED ORDER — CETIRIZINE HCL 10 MG PO TABS: 10.0000 mg | ORAL_TABLET | Freq: Every day | ORAL | 11 refills | Status: DC

## 2017-12-10 MED ORDER — FLUTICASONE PROPIONATE 50 MCG/ACT NA SUSP: 2.0000 | Freq: Every day | NASAL | 3 refills | Status: AC

## 2017-12-10 MED ORDER — CETIRIZINE HCL 10 MG PO TABS: 10.0000 mg | ORAL_TABLET | Freq: Every day | ORAL | 3 refills | Status: AC

## 2017-12-10 MED ORDER — PREDNISONE 10 MG (21) PO TBPK: ORAL_TABLET | ORAL | 0 refills | Status: AC

## 2017-12-10 MED ORDER — BENZONATATE 100 MG PO CAPS: 100.0000 mg | ORAL_CAPSULE | Freq: Every evening | ORAL | 0 refills | Status: AC | PRN

## 2017-12-10 NOTE — Progress Notes (Addendum)
Subjective:     Patient ID: Julie Logan, female   DOB: 1960/11/19, 57 y.o.   MRN: [id number]  HPI   Blood pressure 130/67, pulse 83, temperature 98.1 F (36.7 C), resp. rate 16, height 5\' 3"  (1.6 m), weight 173 lb (78.5 kg), SpO2 97 % No LMP recorded. Patient is postmenopausal.  Patient is a 57 year old female in no acute distress who comes to the clinic she reports she started with a deep cough, mild chest congestion on Saturday and feels it has been also having sinus congestion tickle in her throat.  Hard to sleep due to coughing at times she reports the past two nights.   She also has ear pressure and frontal sinus pressure x 4 days.  She reports there is also mold suspected in her office as well.  She denies any ill exposures.  She denies being immunocompromised.   Patient  denies any fever, body aches,chills, rash, chest pain, shortness of breath, nausea, vomiting, or diarrhea.      Review of Systems  Constitutional: Negative for activity change, appetite change, chills, diaphoresis, fatigue, fever and unexpected weight change.  HENT: Positive for congestion, ear pain, postnasal drip and sinus pressure. Negative for dental problem, drooling, ear discharge, facial swelling, hearing loss, mouth sores, nosebleeds, rhinorrhea, sinus pain, sneezing, sore throat, tinnitus, trouble swallowing and voice change.   Eyes: Negative.   Respiratory: Positive for cough and chest tightness (mild with coughing only ). Negative for apnea, choking, shortness of breath, wheezing and stridor.   Cardiovascular: Negative.   Gastrointestinal: Negative.   Genitourinary: Negative.   Musculoskeletal: Negative.   Skin: Negative.   Allergic/Immunologic: Negative.   Neurological: Negative.   Hematological: Negative.   Psychiatric/Behavioral: Negative.        Objective:   Physical Exam  Constitutional: She is oriented to person, place, and time. Vital signs are normal. She appears  well-developed and well-nourished. She is active.  Non-toxic appearance. She does not have a sickly appearance. She does not appear ill. No distress.  Patient is alert and oriented and responsive to questions Engages in eye contact with provider. Speaks in full sentences without any pauses without any shortness of breath.   Patient moves on and off of exam table and in room without difficulty. Gait is normal in hall and in room. Patient is oriented to person place time and situation. Patient answers questions appropriately and engages in conversation.   HENT:  Head: Normocephalic and atraumatic.  Right Ear: Hearing, external ear and ear canal normal. Tympanic membrane is erythematous. Tympanic membrane is not perforated. A middle ear effusion is present.  Left Ear: Hearing, external ear and ear canal normal. Tympanic membrane is not perforated and not erythematous. A middle ear effusion is present.  Nose: Mucosal edema and rhinorrhea present. Right sinus exhibits frontal sinus tenderness. Right sinus exhibits no maxillary sinus tenderness. Left sinus exhibits frontal sinus tenderness. Left sinus exhibits no maxillary sinus tenderness.  Mouth/Throat: Uvula is midline, oropharynx is clear and moist and mucous membranes are normal. No uvula swelling. No oropharyngeal exudate, posterior oropharyngeal edema, posterior oropharyngeal erythema or tonsillar abscesses. No tonsillar exudate.  Eyes: Pupils are equal, round, and reactive to light. Conjunctivae, EOM and lids are normal. Right eye exhibits no discharge. Left eye exhibits no discharge. No scleral icterus.  Neck: Trachea normal, normal range of motion, full passive range of motion without pain and phonation normal. Neck supple. No JVD present. No tracheal tenderness present. No tracheal deviation present.  No Brudzinski's sign noted.  Cardiovascular: Normal rate, regular rhythm, normal heart sounds and intact distal pulses. Exam reveals no gallop, no  distant heart sounds and no friction rub.  No murmur heard. Pulmonary/Chest: Effort normal and breath sounds normal. No accessory muscle usage or stridor. No respiratory distress. She has no wheezes. She has no rales. She exhibits no tenderness.  Bronchial congestion and mild cough heard in room- lungs clear to auscultation   Abdominal: Soft. Bowel sounds are normal. She exhibits no distension and no mass. There is no tenderness. There is no rebound and no guarding. No hernia.  Musculoskeletal: Normal range of motion.  Lymphadenopathy:       Head (right side): No submental, no submandibular, no tonsillar, no preauricular, no posterior auricular and no occipital adenopathy present.       Head (left side): No submental, no submandibular, no tonsillar, no preauricular, no posterior auricular and no occipital adenopathy present.    She has no cervical adenopathy.  Neurological: She is alert and oriented to person, place, and time. She has normal strength. She displays normal reflexes. No cranial nerve deficit or sensory deficit. She exhibits normal muscle tone. Coordination and gait normal.  Skin: Skin is warm, dry and intact. Capillary refill takes less than 2 seconds. No rash noted. She is not diaphoretic. No erythema. No pallor.  Psychiatric: She has a normal mood and affect. Her speech is normal and behavior is normal. Judgment and thought content normal. Cognition and memory are normal.  Vitals reviewed.   .       Assessment:     Bronchitis  Acute non-recurrent sinusitis, unspecified location  Post-nasal drip      Plan:   Meds ordered this encounter  Medications  . DISCONTD: amoxicillin-clavulanate (AUGMENTIN) 875-125 MG tablet    Sig: Take 1 tablet by mouth 2 (two) times daily.    Dispense:  20 tablet    Refill:  0  . predniSONE (STERAPRED UNI-PAK 21 TAB) 10 MG (21) TBPK tablet    Sig: PO: Take 6 tablets on day 1, Take 5 tablets day 2 Take 4 tablets day 3 Take 3 tablets day 4  Take 2 tablets day five 5 Take 1 tablet day 6    Dispense:  21 tablet    Refill:  0  . DISCONTD: cetirizine (ZYRTEC) 10 MG tablet    Sig: Take 1 tablet (10 mg total) by mouth daily.    Dispense:  30 tablet    Refill:  11  . fluticasone (FLONASE) 50 MCG/ACT nasal spray    Sig: Place 2 sprays into both nostrils daily.    Dispense:  16 g    Refill:  3  . benzonatate (TESSALON) 100 MG capsule    Sig: Take 1 capsule (100 mg total) by mouth at bedtime and may repeat dose one time if needed. For cough. May cause drowsiness.    Dispense:  20 capsule    Refill:  0  . cetirizine (ZYRTEC) 10 MG tablet    Sig: Take 1 tablet (10 mg total) by mouth daily.    Dispense:  30 tablet    Refill:  3   Advised patient call the office or your primary care doctor for an appointment if no improvement within 72 hours or if any symptoms change or worsen at any time  Advised ER or urgent Care if after hours or on weekend. Call 911 for emergency symptoms at any time.Patinet verbalized understanding of all instructions given/reviewed and  treatment plan and has no further questions or concerns at this time.    Patient verbalized understanding of all instructions given and denies any further questions at this time.

## 2017-12-10 NOTE — Patient Instructions (Signed)
Allergies An allergy is when your body reacts to a substance in a way that is not normal. An allergic reaction can happen after you:  Eat something.  Breathe in something.  Touch something.  You can be allergic to:  Things that are only around during certain seasons, like molds and pollens.  Foods.  Drugs.  Insects.  Animal dander.  What are the signs or symptoms?  Puffiness (swelling). This may happen on the lips, face, tongue, mouth, or throat.  Sneezing.  Coughing.  Breathing loudly (wheezing).  Stuffy nose.  Tingling in the mouth.  A rash.  Itching.  Itchy, red, puffy areas of skin (hives).  Watery eyes.  Throwing up (vomiting).  Watery poop (diarrhea).  Dizziness.  Feeling faint or fainting.  Trouble breathing or swallowing.  A tight feeling in the chest.  A fast heartbeat. How is this diagnosed? Allergies can be diagnosed with:  A medical and family history.  Skin tests.  Blood tests.  A food diary. A food diary is a record of all the foods, drinks, and symptoms you have each day.  The results of an elimination diet. This diet involves making sure not to eat certain foods and then seeing what happens when you start eating them again.  How is this treated? There is no cure for allergies, but allergic reactions can be treated with medicine. Severe reactions usually need to be treated at a hospital. How is this prevented? The best way to prevent an allergic reaction is to avoid the thing you are allergic to. Allergy shots and medicines can also help prevent reactions in some cases. This information is not intended to replace advice given to you by your health care provider. Make sure you discuss any questions you have with your health care provider. Document Released: 11/02/2012 Document Revised: 03/04/2016 Document Reviewed: 04/19/2014 Elsevier Interactive Patient Education  2018 [employer] Drip Postnasal drip is the  feeling of mucus going down the back of your throat. Mucus is a slimy substance that moistens and cleans your nose and throat, as well as the air pockets in face bones near your forehead and cheeks (sinuses). Small amounts of mucus pass from your nose and sinuses down the back of your throat all the time. This is normal. When you produce too much mucus or the mucus gets too thick, you can feel it. Some common causes of postnasal drip include:  Having more mucus because of: ? A cold or the flu. ? Allergies. ? Cold air. ? Certain medicines.  Having more mucus that is thicker because of: ? A sinus or nasal infection. ? Dry air. ? A food allergy.  Follow these instructions at home: Relieving discomfort  Gargle with a salt-water mixture 3-4 times a day or as needed. To make a salt-water mixture, completely dissolve -1 tsp of salt in 1 cup of warm water.  If the air in your home is dry, use a humidifier to add moisture to the air.  Use a saline spray or container (neti pot) to flush out the nose (nasal irrigation). These methods can help clear away mucus and keep the nasal passages moist. General instructions  Take over-the-counter and prescription medicines only as told by your health care provider.  Follow instructions from your health care provider about eating or drinking restrictions. You may need to avoid caffeine.  Avoid things that you know you are allergic to (allergens), like dust, mold, pollen, pets, or certain foods.  Drink enough fluid  to keep your urine pale yellow.  Keep all follow-up visits as told by your health care provider. This is important. Contact a health care provider if:  You have a fever.  You have a sore throat.  You have difficulty swallowing.  You have headache.  You have sinus pain.  You have a cough that does not go away.  The mucus from your nose becomes thick and is green or yellow in color.  You have cold or flu symptoms that last more  than 10 days. Summary  Postnasal drip is the feeling of mucus going down the back of your throat.  If your health care provider approves, use nasal irrigation or a nasal spray 2?4 times a day.  Avoid things that you know you are allergic to (allergens), like dust, mold, pollen, pets, or certain foods. This information is not intended to replace advice given to you by your health care provider. Make sure you discuss any questions you have with your health care provider. Document Released: 10/21/2016 Document Revised: 10/21/2016 Document Reviewed: 10/21/2016 Elsevier Interactive Patient Education  2018 [employer]. Acute Bronchitis, Adult Acute bronchitis is when air tubes (bronchi) in the lungs suddenly get swollen. The condition can make it hard to breathe. It can also cause these symptoms:  A cough.  Coughing up clear, yellow, or green mucus.  Wheezing.  Chest congestion.  Shortness of breath.  A fever.  Body aches.  Chills.  A sore throat.  Follow these instructions at home: Medicines  Take over-the-counter and prescription medicines only as told by your doctor.  If you were prescribed an antibiotic medicine, take it as told by your doctor. Do not stop taking the antibiotic even if you start to feel better. General instructions  Rest.  Drink enough fluids to keep your pee (urine) clear or pale yellow.  Avoid smoking and secondhand smoke. If you smoke and you need help quitting, ask your doctor. Quitting will help your lungs heal faster.  Use an inhaler, cool mist vaporizer, or humidifier as told by your doctor.  Keep all follow-up visits as told by your doctor. This is important. How is this prevented? To lower your risk of getting this condition again:  Wash your hands often with soap and water. If you cannot use soap and water, use hand sanitizer.  Avoid contact with people who have cold symptoms.  Try not to touch your hands to your mouth, nose, or  eyes.  Make sure to get the flu shot every year.  Contact a doctor if:  Your symptoms do not get better in 2 weeks. Get help right away if:  You cough up blood.  You have chest pain.  You have very bad shortness of breath.  You become dehydrated.  You faint (pass out) or keep feeling like you are going to pass out.  You keep throwing up (vomiting).  You have a very bad headache.  Your fever or chills gets worse. This information is not intended to replace advice given to you by your health care provider. Make sure you discuss any questions you have with your health care provider. Document Released: 12/25/2007 Document Revised: 02/14/2016 Document Reviewed: 12/27/2015 Elsevier Interactive Patient Education  [employer].

## 2017-12-22 ENCOUNTER — Ambulatory Visit: Payer: Self-pay | Admitting: Medical

## 2017-12-22 VITALS — BP 124/83 | HR 93 | Temp 98.7°F | Resp 16 | Wt 173.0 lb

## 2017-12-22 DIAGNOSIS — R0981 Nasal congestion: Secondary | ICD-10-CM

## 2017-12-22 DIAGNOSIS — H6983 Other specified disorders of Eustachian tube, bilateral: Secondary | ICD-10-CM

## 2017-12-22 DIAGNOSIS — J029 Acute pharyngitis, unspecified: Secondary | ICD-10-CM

## 2017-12-23 ENCOUNTER — Encounter: Payer: Self-pay | Admitting: Medical

## 2017-12-23 NOTE — Progress Notes (Signed)
   Subjective:    Patient ID: Clayton Bibles, female    DOB: 1960/09/30, 57 y.o.   MRN: [id number]  HPI 57 yo female in non acue distress seen on 12/10/17 with Bronchitis in for a recheck today. She is feeling much better today but still has sore  throat , full ears and post nasal drip and sinus headache complaints today. Using Flonase, Tessalon Perles, and Zyrtec.  She say her ears feel "stuffy" and she has clear productive cough "alittle" and clear nasal discharge.  She is traveling to [place] and [place] leaving Wednesday and wanted to make sure she was well. She will be traveling x 2 weeks.  Review of Systems  Constitutional: Negative for chills and fever.  HENT: Positive for postnasal drip and sore throat. Negative for ear pain.   Respiratory: Positive for cough (mild productive clear). Negative for shortness of breath.   Cardiovascular: Negative for chest pain.  Gastrointestinal: Negative for abdominal pain.  Musculoskeletal: Negative for myalgias.  Skin: Negative for rash.  Psychiatric/Behavioral: Negative for behavioral problems, self-injury and suicidal ideas.       Objective:   Physical Exam  Constitutional: She is oriented to person, place, and time. She appears well-developed and well-nourished.  HENT:  Head: Normocephalic and atraumatic.  Right Ear: Hearing, external ear and ear canal normal. A middle ear effusion is present.  Left Ear: Hearing, external ear and ear canal normal. A middle ear effusion is present.  Nose: Mucosal edema (left side, clear discharge) and rhinorrhea present. Right sinus exhibits no maxillary sinus tenderness and no frontal sinus tenderness. Left sinus exhibits no maxillary sinus tenderness and no frontal sinus tenderness.  Mouth/Throat: Uvula is midline, oropharynx is clear and moist and mucous membranes are normal. Oral lesions (2 tiny ulcrations on uvula) present. Uvula swelling (mild) present. Tonsils are 0 on the right. Tonsils are 0 on the  left.  Eyes: Pupils are equal, round, and reactive to light. Conjunctivae and EOM are normal.  Neck: Normal range of motion. Neck supple.  Cardiovascular: Normal rate, regular rhythm and normal heart sounds.  Pulmonary/Chest: Effort normal and breath sounds normal.  Lymphadenopathy:    She has no cervical adenopathy.  Neurological: She is alert and oriented to person, place, and time.  Skin: Skin is warm and dry.  Psychiatric: She has a normal mood and affect. Her behavior is normal. Judgment and thought content normal.  Nursing note and vitals reviewed.     Assessment & Plan:  Computer network not working so could not document about patient till at a later date.  Bronchitis improving Ulcerations on throat, sore throat Sinus headache Eustachian tube dysfunction Continue Flonase and Zyrtec. Prednisone taper 6 tablets by mouth today then  5 tablets by mouth tomorrow the one less each day thereafter. #21 no rx ,  Sudafed take as directed.  To hopefully help ears , she is flying in 2 days. Dilute salt water gargles 3-4 times a day. Return to the clinic as needed.  If worsening symptoms to seek out medical care while traveling.   Patient verbalizes understanding and has no questions at discharge.

## 2017-12-24 NOTE — Addendum Note (Signed)
Addended by: Doreen Beam on: 12/24/2017 02:51 PM   Modules accepted: Level of Service

## 2018-12-09 IMAGING — US US ABDOMEN LIMITED
1 series · 14 of 25 positions shown · non-contrast
Comparison: None.

CLINICAL DATA: Right upper quadrant abdominal pain.  Leukocytosis.

EXAM:
US ABDOMEN LIMITED - RIGHT UPPER QUADRANT

[Series 1: us abdomen limited · 0.26mm/px · 14 of 47 slices shown]
[im 1/47]
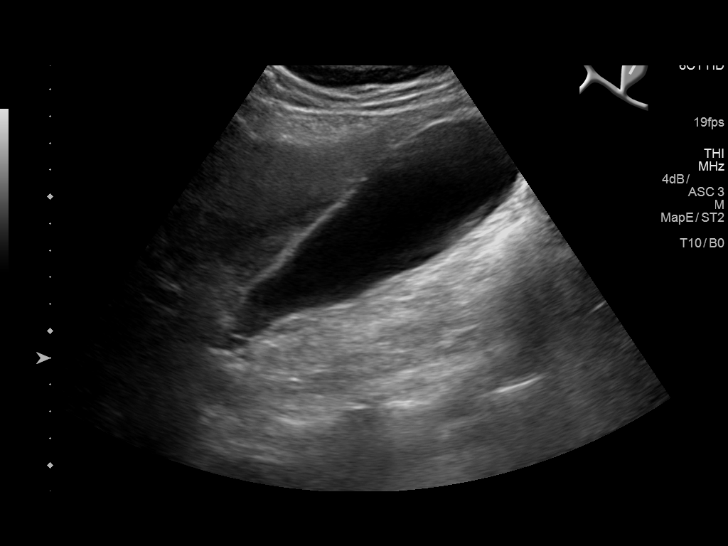
[im 4/47]
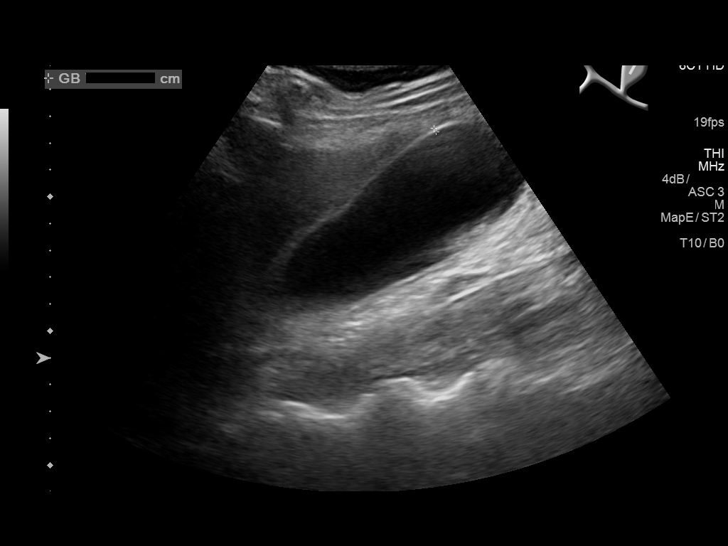
[im 8/47]
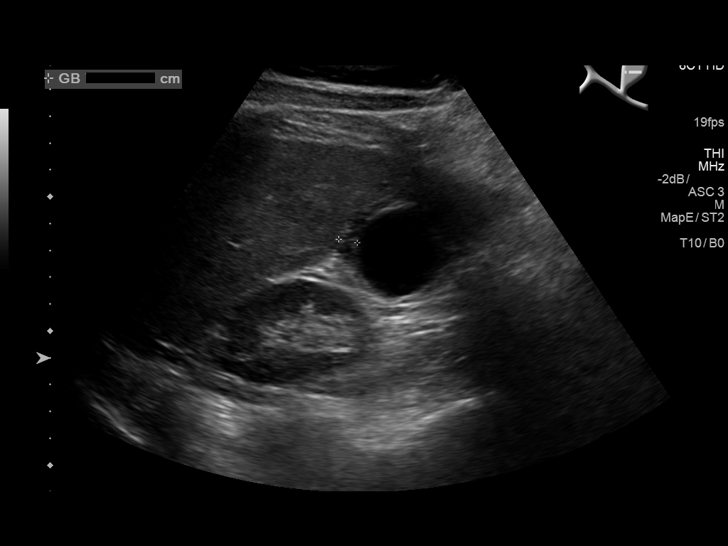
[im 12/47]
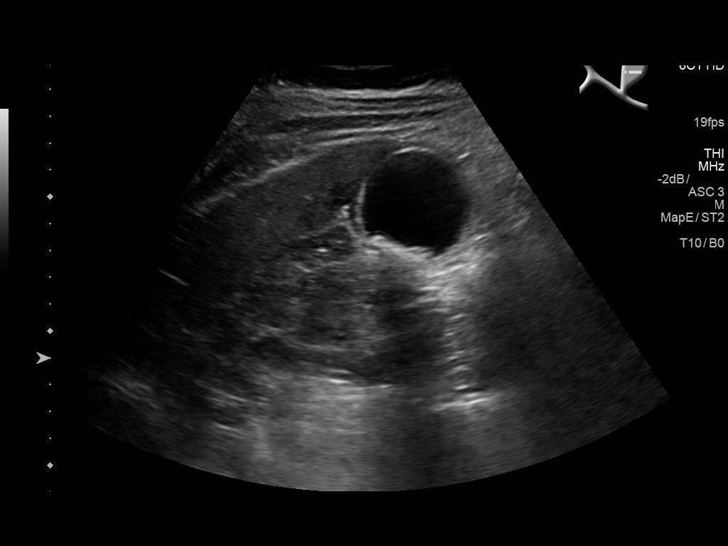
[im 16/47]
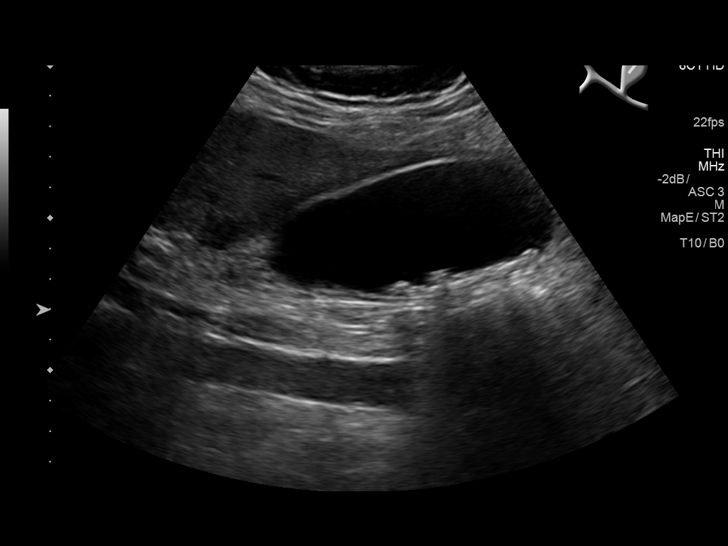
[im 18/47]
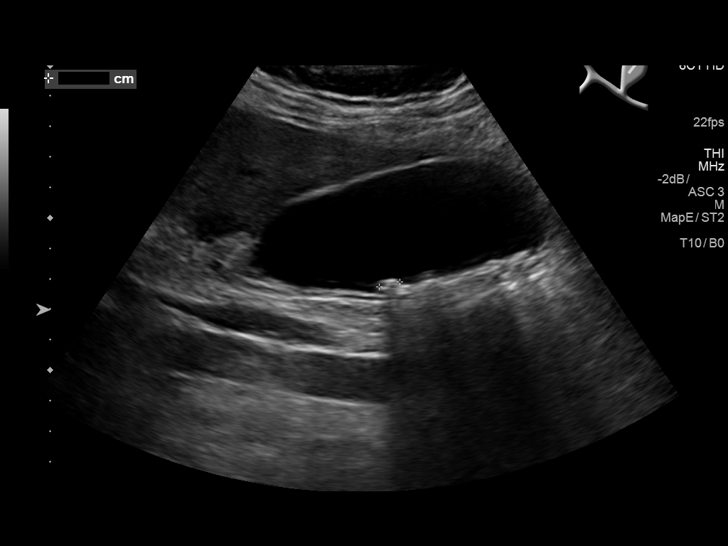
[im 22/47]
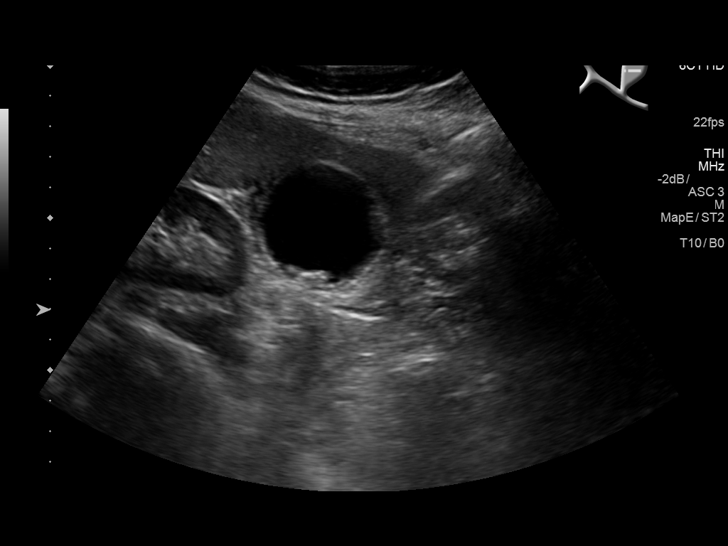
[im 25/47]
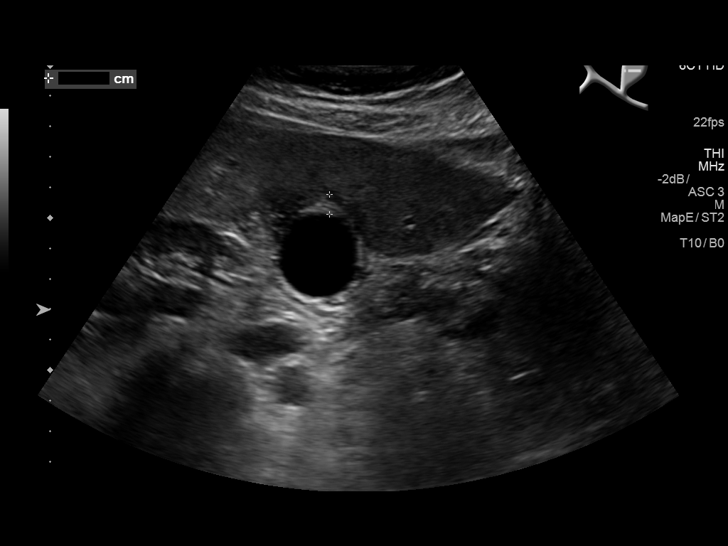
[im 29/47]
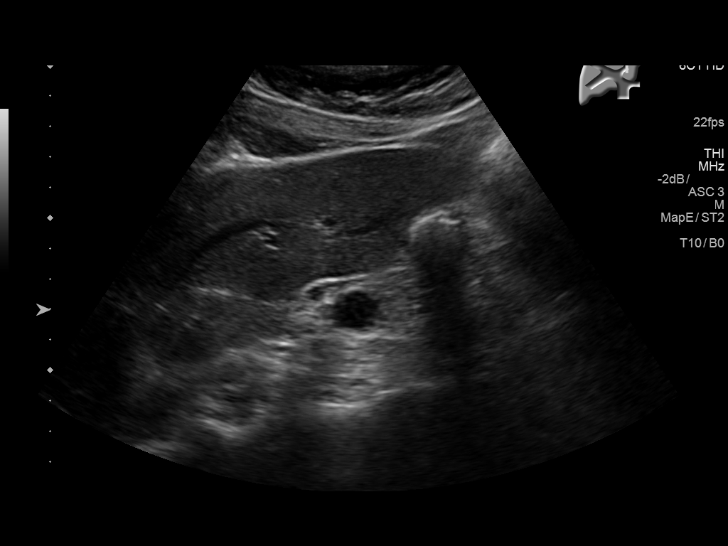
[im 31/47]
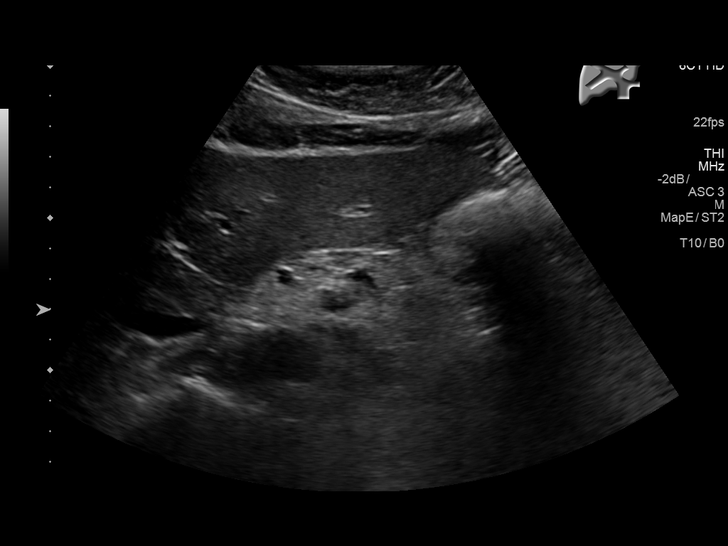
[im 35/47]
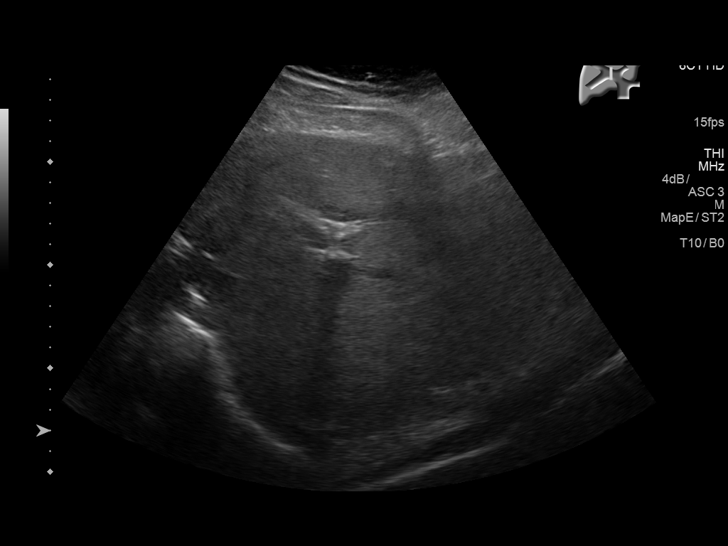
[im 39/47]
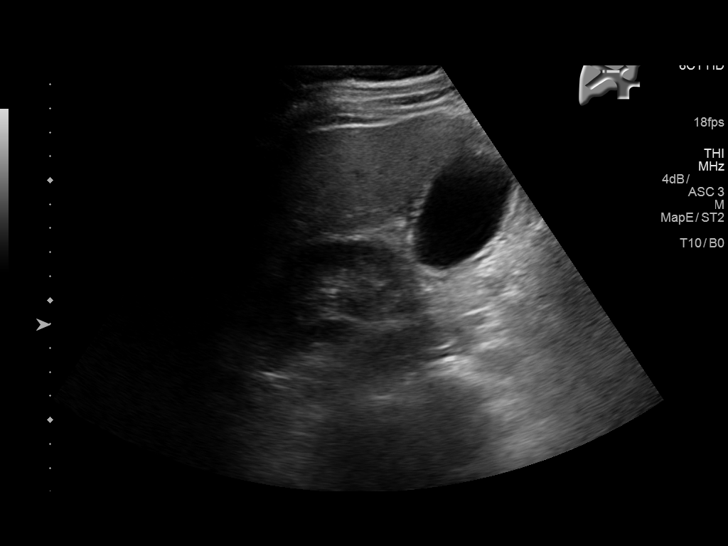
[im 43/47]
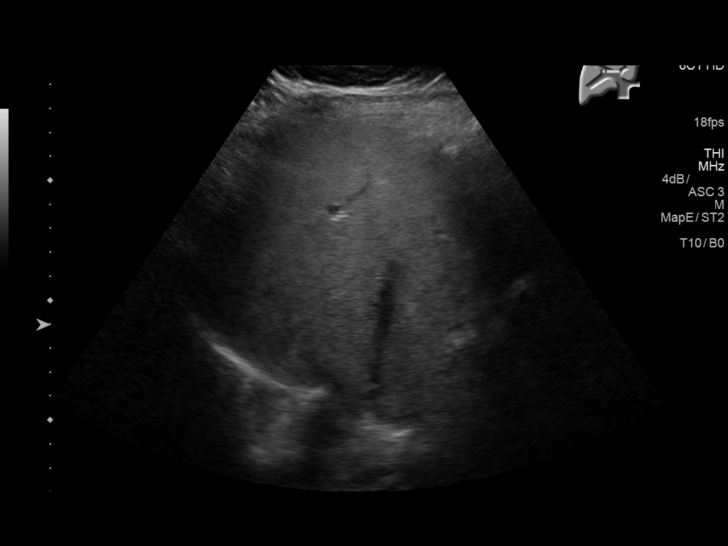
[im 47/47]
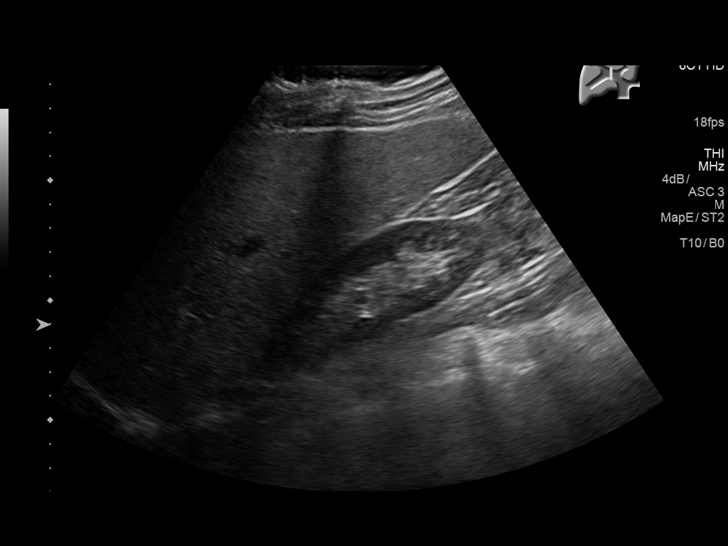

[14 of 25 positions shown; findings below may reference images not displayed]

FINDINGS: Gallbladder:

The gallbladder wall is thickened measuring up to 7 mm. Multiple
shadowing calculi measure up to 7 mm. There is no sonographic
Murphy's sign.

Common bile duct:

Diameter: 3.1 mm, within normal limits.

Liver:

The liver is mildly echogenic. No discrete lesions are present.
There is some loss of internal architecture.
IMPRESSION: 1. Multiple mobile shadowing stones and gallbladder wall thickening
suggesting acute cholecystitis.
2. Increased echogenicity of the liver suggesting hepatic steatosis.
